# Patient Record
Sex: Male | Born: 1970 | Race: White | Hispanic: No | Smoking: Current every day smoker
Health system: Southern US, Community
[De-identification: ages and names within clinical notes are randomized; demographics above are authoritative.]

## PROBLEM LIST (undated history)

## (undated) DIAGNOSIS — Z8701 Personal history of pneumonia (recurrent): Secondary | ICD-10-CM

---

## 2018-03-01 ENCOUNTER — Ambulatory Visit
Admission: EM | Admit: 2018-03-01 | Discharge: 2018-03-01 | Disposition: A | Discharge status: Home / Self Care | Payer: No Typology Code available for payment source | Attending: Family Medicine | Admitting: Family Medicine

## 2018-03-01 ENCOUNTER — Ambulatory Visit (INDEPENDENT_AMBULATORY_CARE_PROVIDER_SITE_OTHER): Payer: No Typology Code available for payment source

## 2018-03-01 DIAGNOSIS — F17210 Nicotine dependence, cigarettes, uncomplicated: Secondary | ICD-10-CM | POA: Diagnosis not present

## 2018-03-01 DIAGNOSIS — J40 Bronchitis, not specified as acute or chronic: Secondary | ICD-10-CM

## 2018-03-01 DIAGNOSIS — R0602 Shortness of breath: Secondary | ICD-10-CM

## 2018-03-01 DIAGNOSIS — J209 Acute bronchitis, unspecified: Secondary | ICD-10-CM

## 2018-03-01 DIAGNOSIS — R062 Wheezing: Secondary | ICD-10-CM

## 2018-03-01 HISTORY — DX: Personal history of pneumonia (recurrent): Z87.01

## 2018-03-01 MED ORDER — PREDNISONE 50 MG PO TABS: ORAL_TABLET | ORAL | 0 refills | Status: DC

## 2018-03-01 MED ORDER — ALBUTEROL SULFATE HFA 108 (90 BASE) MCG/ACT IN AERS: 1.0000 | INHALATION_SPRAY | Freq: Four times a day (QID) | RESPIRATORY_TRACT | 0 refills | Status: DC | PRN

## 2018-03-01 MED ORDER — METHYLPREDNISOLONE SODIUM SUCC 125 MG IJ SOLR
125.0000 mg | Freq: Once | INTRAMUSCULAR | Status: AC
  Administered 2018-03-01: 125 mg via INTRAMUSCULAR

## 2018-03-01 MED ORDER — AZITHROMYCIN 250 MG PO TABS: ORAL_TABLET | ORAL | 0 refills | Status: DC

## 2018-03-01 MED ORDER — IPRATROPIUM-ALBUTEROL 0.5-2.5 (3) MG/3ML IN SOLN
3.0000 mL | Freq: Four times a day (QID) | RESPIRATORY_TRACT | Status: DC
  Administered 2018-03-01: 3 mL via RESPIRATORY_TRACT

## 2018-03-01 NOTE — ED Provider Notes (Addendum)
[facility]    CSN: [id number] Arrival date & time: 03/01/18  1247  History   Chief Complaint Chief Complaint  Patient presents with  . Wheezing   HPI  47 year old male presents with cough, wheezing, shortness of breath, subjective fever.  Patient reports a 4-day history of cough, shortness of breath, wheezing, and subjective fever.  Patient states that he is more short of breath with exertion.  Symptoms are severe.  He has taken Tylenol and NyQuil without relief.  He is a current smoker.  No documented history of COPD.  Reports a history of pneumonia and states that it feels similar to past occurrence.  No other reported symptoms.  No other complaints.  PMH, Surgical Hx, Family Hx, Social History reviewed and updated as below.  Past Medical History:  Diagnosis Date  . History of pneumonia    Surgical Hx:  No past surgeries.   Home Medications    Prior to Admission medications   Medication Sig Start Date End Date Taking? Authorizing Provider  albuterol (PROVENTIL HFA;VENTOLIN HFA) 108 (90 Base) MCG/ACT inhaler Inhale 1-2 puffs into the lungs every 6 (six) hours as needed for wheezing or shortness of breath. 03/01/18   Tommie Sams, DO  azithromycin (ZITHROMAX) 250 MG tablet 2 tablets on day 1, then 1 tablet daily on days 2-5. 03/01/18   Tommie Sams, DO  predniSONE (DELTASONE) 50 MG tablet 1 tablet daily x 5 days. Start 11/3. 03/01/18   Tommie Sams, DO    Family History Family History  Problem Relation Age of Onset  . Healthy Mother   . Healthy Father     Social History Social History   Tobacco Use  . Smoking status: Current Every Day Smoker  . Smokeless tobacco: Never Used  Substance Use Topics  . Alcohol use: Yes    Frequency: Never  . Drug use: Not on file    Allergies   Patient has no known allergies.   Review of Systems Review of Systems  Constitutional: Positive for fever.  Respiratory: Positive for cough, chest tightness, shortness of  breath and wheezing.    Physical Exam Triage Vital Signs ED Triage Vitals  Enc Vitals Group     BP 03/01/18 1256 125/63     Pulse Rate 03/01/18 1256 (!) 109     Resp 03/01/18 1256 18     Temp 03/01/18 1256 98.5 F (36.9 C)     Temp Source 03/01/18 1256 Oral     SpO2 03/01/18 1256 93 %     Weight 03/01/18 1258 240 lb (108.9 kg)     Height --      Head Circumference --      Peak Flow --      Pain Score 03/01/18 1257 0     Pain Loc --      Pain Edu? --      Excl. in GC? --    Updated Vital Signs BP 125/63 (BP Location: Left Arm)   Pulse (!) 109   Temp 98.5 F (36.9 C) (Oral)   Resp 18   Wt 108.9 kg   SpO2 93%   Visual Acuity Right Eye Distance:   Left Eye Distance:   Bilateral Distance:    Right Eye Near:   Left Eye Near:    Bilateral Near:     Physical Exam  Constitutional: He is oriented to person, place, and time. He appears well-developed.  Patient appears to be in mild respiratory distress.  Tachypneic.  HENT:  Head: Normocephalic and atraumatic.  Oropharynx with diffuse erythema.  Eyes: Conjunctivae are normal. Right eye exhibits no discharge. Left eye exhibits no discharge.  Cardiovascular:  Tachycardia.  Regular rhythm.  Pulmonary/Chest:  Diffuse coarse breath sounds and expiratory wheezing.  Tachypnea noted.  Neurological: He is alert and oriented to person, place, and time.  Skin: Skin is warm. No rash noted.  Psychiatric: He has a normal mood and affect. His behavior is normal.  Nursing note and vitals reviewed.  UC Treatments / Results  Labs (all labs ordered are listed, but only abnormal results are displayed) Labs Reviewed - No data to display  EKG None  Radiology Dg Chest 2 View  Result Date: 03/01/2018 CLINICAL DATA:  Shortness of breath and wheezing for 2-3 days. EXAM: CHEST - 2 VIEW COMPARISON:  None. FINDINGS: Cardiomediastinal silhouette is normal. Mediastinal contours appear intact. There is no evidence of focal airspace  consolidation, pleural effusion or pneumothorax. Mild peribronchial thickening with central predominance. There is a 7 mm hyperdense nodule which projects in the anterior thorax seen on the lateral view. Osseous structures are without acute abnormality. Soft tissues are grossly normal. IMPRESSION: Mild peribronchial thickening with central predominance which may be seen with reactive airway disease or acute bronchitis. 7 mm hyperdense nodule projects in the anterior mid thorax, seen on the lateral view. This may represent a pulmonary nodule or a sclerotic focus within an overlying rib. Further evaluation with chest CT without contrast on a nonemergent basis may be considered. Electronically Signed   By: Ted Mcalpine M.D.   On: 03/01/2018 13:56    Procedures Procedures (including critical care time)  Medications Ordered in UC Medications  methylPREDNISolone sodium succinate (SOLU-MEDROL) 125 mg/2 mL injection 125 mg (125 mg Intramuscular Given 03/01/18 1345)    Initial Impression / Assessment and Plan / UC Course  I have reviewed the triage vital signs and the nursing notes.  Pertinent labs & imaging results that were available during my care of the patient were reviewed by me and considered in my medical decision making (see chart for details).    47 year old male presents with acute bronchitis.  His chest x-ray was negative for pneumonia.  He does have a 7 mm nodule that needs outpatient CT.  I informed the patient of this.  Patient was given albuterol as well as Solu-Medrol here.  Discharging home on azithromycin, prednisone to be started tomorrow, and albuterol.  Final Clinical Impressions(s) / UC Diagnoses   Final diagnoses:  Bronchitis     Discharge Instructions     Medications as prescribed.  You need outpatient CT for follow up of a 7 mm area.   Take care  Dr. Adriana Simas     ED Prescriptions    Medication Sig Dispense Auth. Provider   predniSONE (DELTASONE) 50 MG tablet  1 tablet daily x 5 days. Start 11/3. 5 tablet Ebany Bowermaster G, DO   albuterol (PROVENTIL HFA;VENTOLIN HFA) 108 (90 Base) MCG/ACT inhaler Inhale 1-2 puffs into the lungs every 6 (six) hours as needed for wheezing or shortness of breath. 1 Inhaler Jerrard Bradburn G, DO   azithromycin (ZITHROMAX) 250 MG tablet 2 tablets on day 1, then 1 tablet daily on days 2-5. 6 tablet Tommie Sams, DO     Controlled Substance Prescriptions [place] Controlled Substance Registry consulted? Not Applicable   Tommie Sams, DO 03/01/18 1409    Tommie Sams, DO 03/01/18 1409

## 2018-03-01 NOTE — ED Triage Notes (Signed)
Pt here for dry cough, wheezing, sob, for 3-4 days. Had a fever in the beginning. Has been taking Nyquil and tylenol pm without relief but does help him sleep.

## 2018-03-01 NOTE — ED Notes (Signed)
Patient transported to X-ray 

## 2018-03-01 NOTE — Discharge Instructions (Signed)
Medications as prescribed.  You need outpatient CT for follow up of a 7 mm area.   Take care  Dr. Adriana Simas

## 2019-11-21 IMAGING — CR DG CHEST 2V
2 series · 2 of 2 positions shown · non-contrast
Comparison: None.

CLINICAL DATA: Shortness of breath and wheezing for 2-3 days.

EXAM:
CHEST - 2 VIEW

[chest pa]
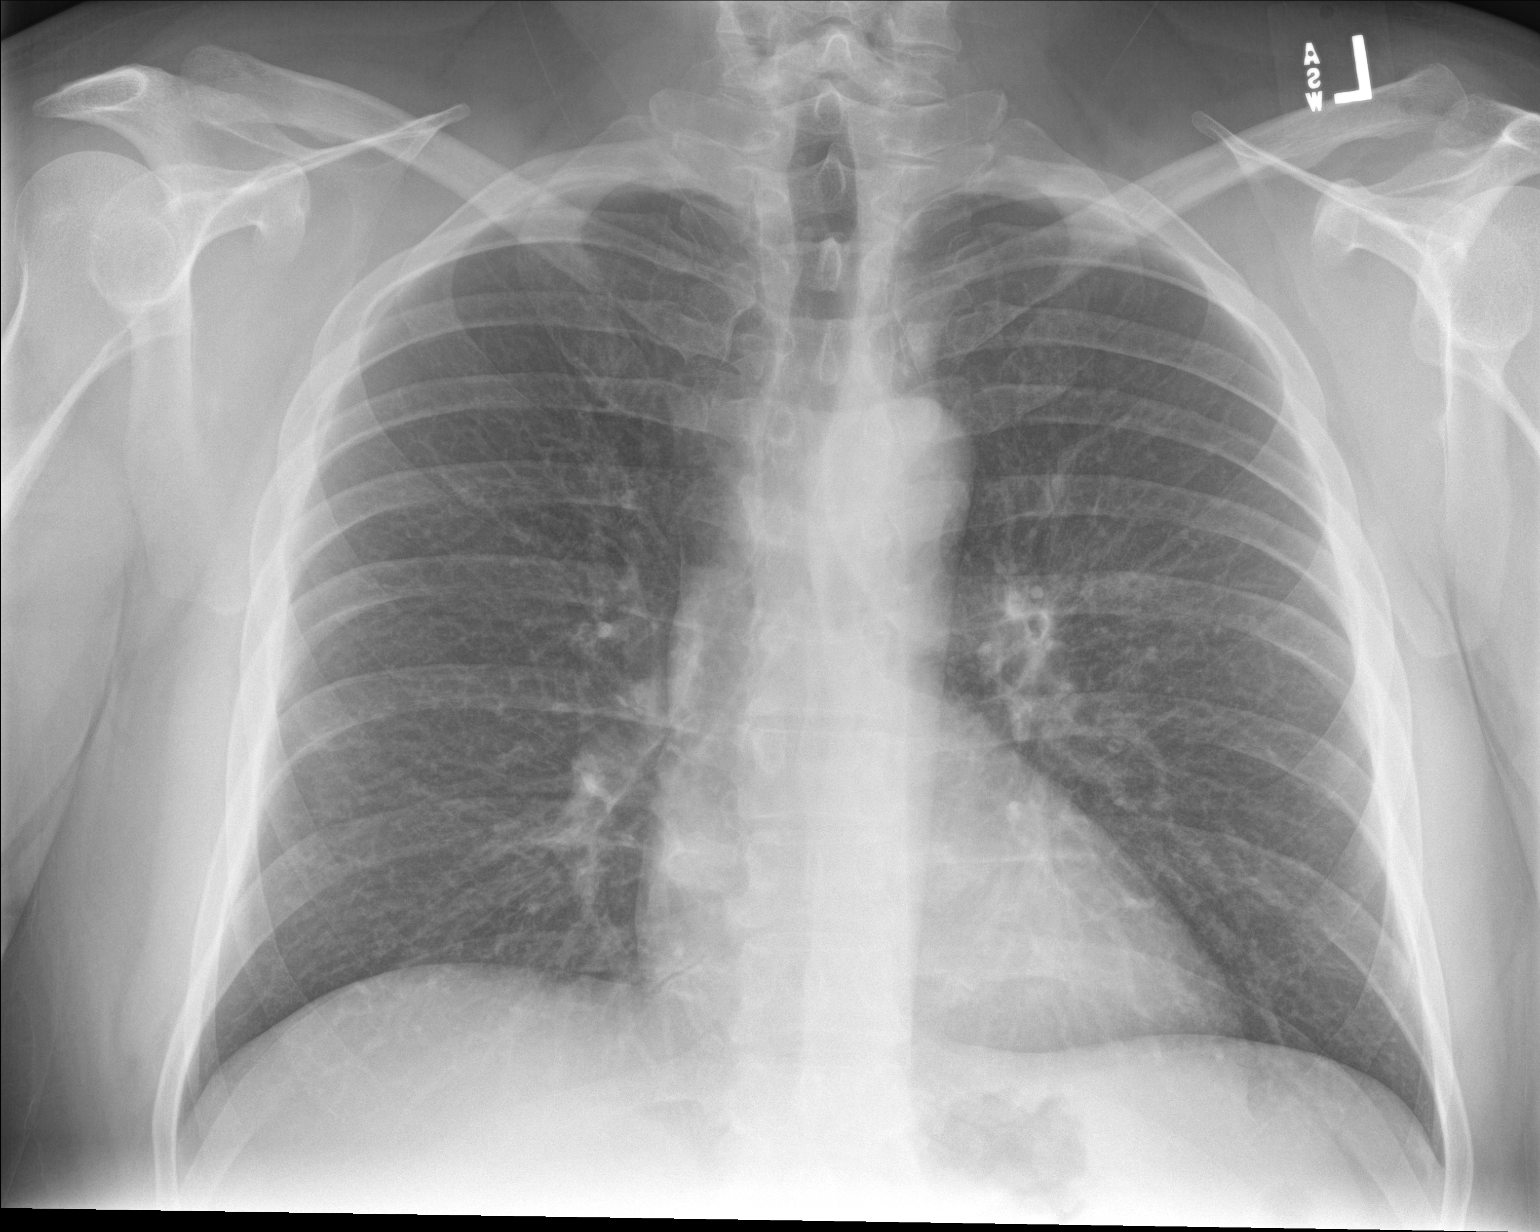

[chest lat]
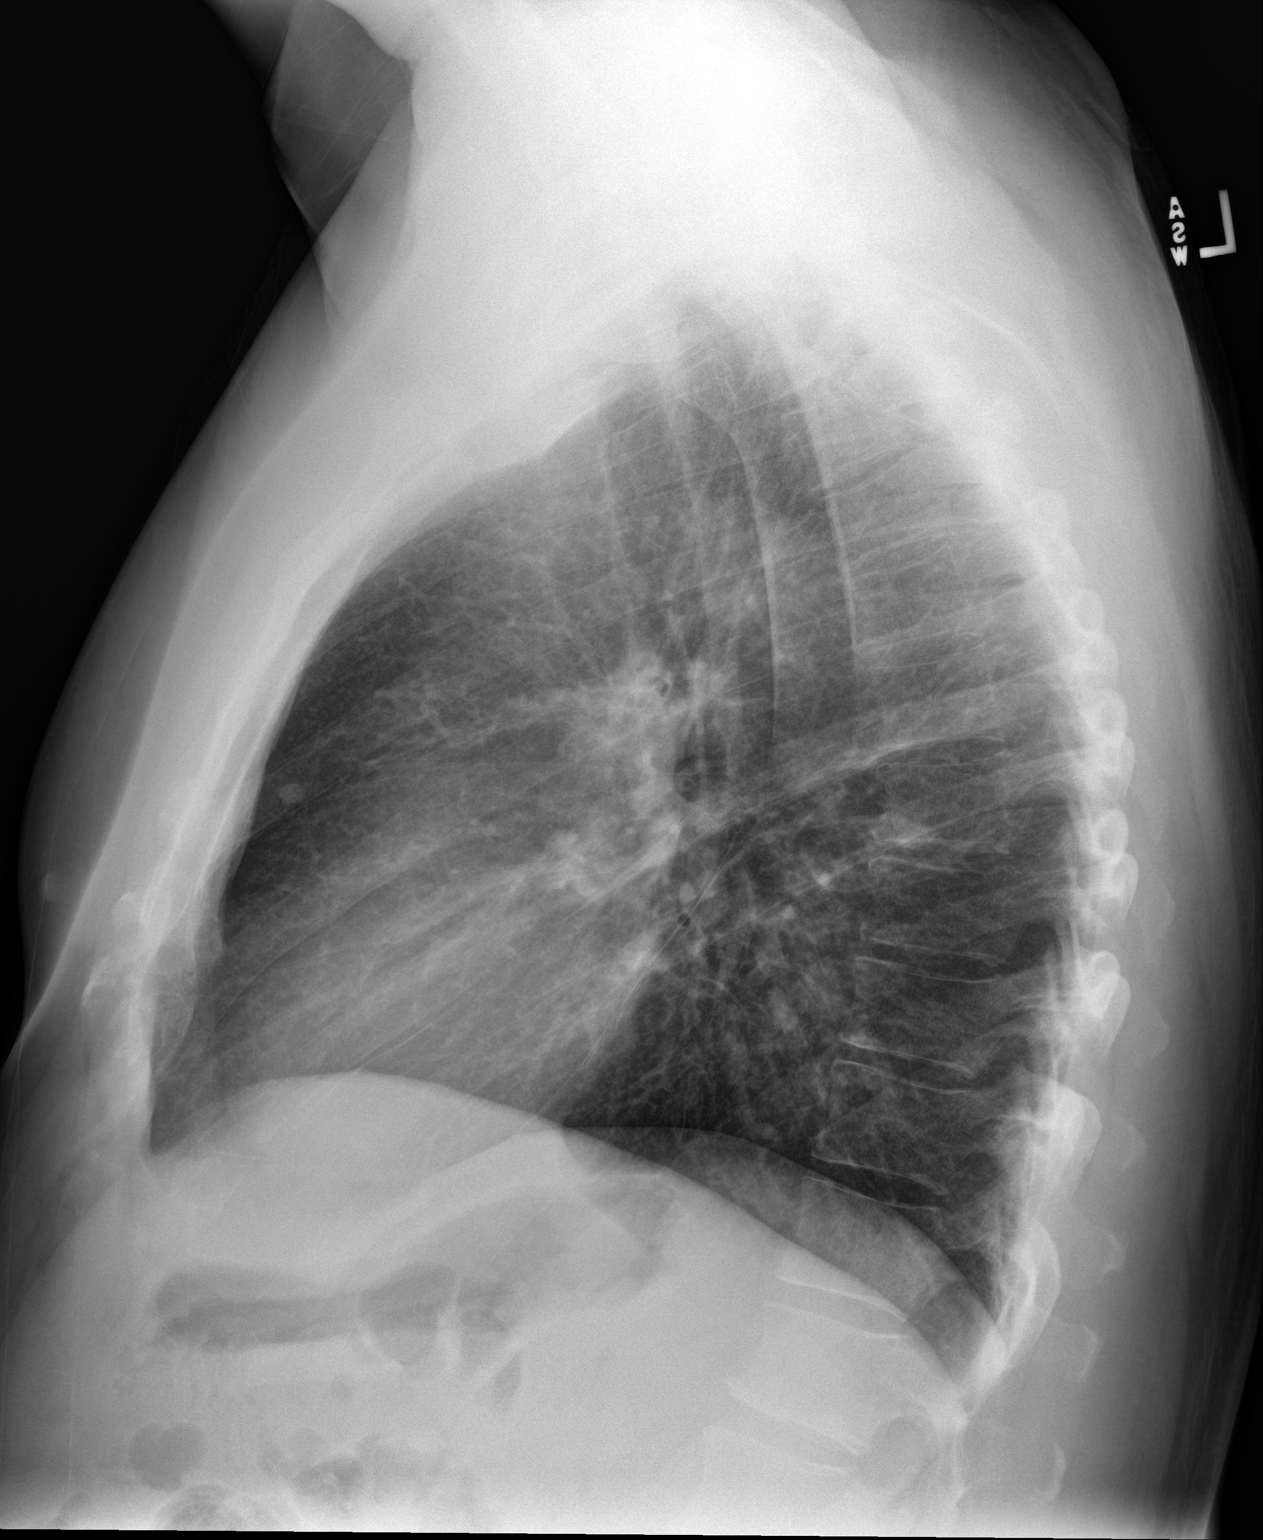

[2 of 2 positions shown; findings below may reference images not displayed]

FINDINGS: Cardiomediastinal silhouette is normal. Mediastinal contours appear
intact.

There is no evidence of focal airspace consolidation, pleural
effusion or pneumothorax. Mild peribronchial thickening with central
predominance. There is a 7 mm hyperdense nodule which projects in
the anterior thorax seen on the lateral view.

Osseous structures are without acute abnormality. Soft tissues are
grossly normal.
IMPRESSION: Mild peribronchial thickening with central predominance which may be
seen with reactive airway disease or acute bronchitis.

7 mm hyperdense nodule projects in the anterior mid thorax, seen on
the lateral view. This may represent a pulmonary nodule or a
sclerotic focus within an overlying rib. Further evaluation with
chest CT without contrast on a nonemergent basis may be considered.

## 2023-12-02 ENCOUNTER — Ambulatory Visit: Payer: Self-pay | Admitting: Family Medicine

## 2023-12-02 ENCOUNTER — Ambulatory Visit
Admission: EM | Admit: 2023-12-02 | Discharge: 2023-12-02 | Disposition: A | Discharge status: Home / Self Care | Attending: Family Medicine | Admitting: Family Medicine

## 2023-12-02 ENCOUNTER — Ambulatory Visit (INDEPENDENT_AMBULATORY_CARE_PROVIDER_SITE_OTHER)

## 2023-12-02 DIAGNOSIS — J209 Acute bronchitis, unspecified: Secondary | ICD-10-CM

## 2023-12-02 DIAGNOSIS — F17210 Nicotine dependence, cigarettes, uncomplicated: Secondary | ICD-10-CM

## 2023-12-02 DIAGNOSIS — R0602 Shortness of breath: Secondary | ICD-10-CM

## 2023-12-02 MED ORDER — PREDNISONE 50 MG PO TABS: ORAL_TABLET | ORAL | 0 refills | Status: AC

## 2023-12-02 MED ORDER — IPRATROPIUM-ALBUTEROL 0.5-2.5 (3) MG/3ML IN SOLN
3.0000 mL | Freq: Once | RESPIRATORY_TRACT | Status: AC
  Administered 2023-12-02: 3 mL via RESPIRATORY_TRACT

## 2023-12-02 MED ORDER — ALBUTEROL SULFATE HFA 108 (90 BASE) MCG/ACT IN AERS: 2.0000 | INHALATION_SPRAY | RESPIRATORY_TRACT | 0 refills | Status: DC | PRN

## 2023-12-02 MED ORDER — PROMETHAZINE-DM 6.25-15 MG/5ML PO SYRP: 5.0000 mL | ORAL_SOLUTION | Freq: Four times a day (QID) | ORAL | 0 refills | Status: AC | PRN

## 2023-12-02 MED ORDER — ALBUTEROL SULFATE HFA 108 (90 BASE) MCG/ACT IN AERS: 2.0000 | INHALATION_SPRAY | RESPIRATORY_TRACT | 0 refills | Status: AC | PRN

## 2023-12-02 MED ORDER — DEXAMETHASONE SODIUM PHOSPHATE 10 MG/ML IJ SOLN
10.0000 mg | Freq: Once | INTRAMUSCULAR | Status: AC
  Administered 2023-12-02: 10 mg via INTRAMUSCULAR

## 2023-12-02 MED ORDER — PROMETHAZINE-DM 6.25-15 MG/5ML PO SYRP: 5.0000 mL | ORAL_SOLUTION | Freq: Four times a day (QID) | ORAL | 0 refills | Status: DC | PRN

## 2023-12-02 MED ORDER — PREDNISONE 50 MG PO TABS: ORAL_TABLET | ORAL | 0 refills | Status: DC

## 2023-12-02 MED ORDER — AZITHROMYCIN 250 MG PO TABS: ORAL_TABLET | ORAL | 0 refills | Status: AC

## 2023-12-02 MED ORDER — AZITHROMYCIN 250 MG PO TABS: ORAL_TABLET | ORAL | 0 refills | Status: DC

## 2023-12-02 NOTE — Discharge Instructions (Addendum)
 Stop by the pharmacy to pick up your prescriptions.  Follow up with your primary care provider or return to the urgent care, if not improving.   The lung nodule seen back in 2019 was seen again today. Your chest xray did not show evidence of pneumonia though the radiologist has not yet read it. If they find something that I didn't, I will call you.  Follow up with your primary care provider or lung care provider.

## 2023-12-02 NOTE — ED Triage Notes (Signed)
 Sx x 1 week  SOB Chills  Productive cough with yellow mucus

## 2023-12-02 NOTE — ED Provider Notes (Signed)
 [facility]    CSN: [id number] Arrival date & time: 12/02/23  0900      History   Chief Complaint Chief Complaint  Patient presents with   Cough   Shortness of Breath    HPI Nicholas Lowe is a 53 y.o. male.   HPI  History obtained from the patient. Nicholas Lowe presents for shortness of breath, productive cough and wheezing that started about a week ago. Believes he has pneumonia.  Feels like his lungs are filled up with mucus.  Has no energy. Shortness of breath get worse with movement. Denies fever or chills. Took some Nyquil last night.   No history of asthma. Denies history of COPD. Smoked since he was about 53 years old. Smokes 2 ppd.     Past Medical History:  Diagnosis Date   History of pneumonia     There are no active problems to display for this patient.   History reviewed. No pertinent surgical history.     Home Medications    Prior to Admission medications   Medication Sig Start Date End Date Taking? Authorizing Provider  albuterol  (VENTOLIN  HFA) 108 (90 Base) MCG/ACT inhaler Inhale 2 puffs into the lungs every 4 (four) hours as needed for wheezing or shortness of breath. 12/02/23   Okechukwu Regnier, DO  azithromycin  (ZITHROMAX ) 250 MG tablet 2 tablets on day 1, then 1 tablet daily on days 2-5. 12/02/23   Riggs Dineen, DO  predniSONE  (DELTASONE ) 50 MG tablet 1 tablet daily x 5 days. Start 11/3. 12/02/23   Zeph Riebel, DO  promethazine -dextromethorphan (PROMETHAZINE -DM) 6.25-15 MG/5ML syrup Take 5 mLs by mouth 4 (four) times daily as needed. 12/02/23   Lucia Mccreadie, DO    Family History Family History  Problem Relation Age of Onset   Healthy Mother    Healthy Father     Social History Social History   Tobacco Use   Smoking status: Every Day   Smokeless tobacco: Never  Vaping Use   Vaping status: Never Used  Substance Use Topics   Alcohol use: Yes     Allergies   Patient has no known allergies.   Review of  Systems Review of Systems: negative unless otherwise stated in HPI.      Physical Exam Triage Vital Signs ED Triage Vitals [12/02/23 0923]  Encounter Vitals Group     BP 134/81     Girls Systolic BP Percentile      Girls Diastolic BP Percentile      Boys Systolic BP Percentile      Boys Diastolic BP Percentile      Pulse Rate (!) 103     Resp (!) 24     Temp 98.9 F (37.2 C)     Temp Source Oral     SpO2 92 %     Weight      Height      Head Circumference      Peak Flow      Pain Score 0     Pain Loc      Pain Education      Exclude from Growth Chart    No data found.  Updated Vital Signs BP 134/81 (BP Location: Right Arm)   Pulse (!) 103   Temp 98.9 F (37.2 C) (Oral)   Resp (!) 24   SpO2 92%   Visual Acuity Right Eye Distance:   Left Eye Distance:   Bilateral Distance:    Right Eye Near:   Left Eye Near:  Bilateral Near:     Physical Exam GEN:     alert, non-toxic appearing male    HENT:  mucus membranes moist, oropharyngeal without lesions or erythema, no nasal discharge EYES:   no scleral injection or discharge RESP:  Tachypnea, coarse breathe sounds bilaterally CVS:   regular  rhythm, tachycardic  Skin:   warm and dry    UC Treatments / Results  Labs (all labs ordered are listed, but only abnormal results are displayed) Labs Reviewed - No data to display  EKG   Radiology DG Chest 2 View Result Date: 12/02/2023 CLINICAL DATA:  Shortness of breath and cough for 1 week. EXAM: CHEST - 2 VIEW COMPARISON:  03/01/2018. FINDINGS: There is new/increased bilateral bronchial wall thickening/peribronchial cuffing. Findings are most commonly seen with bronchitis or reactive airway disease, such as asthma. Bilateral lung fields are otherwise clear. No acute consolidation or lung collapse. Bilateral costophrenic angles are clear. Normal cardio-mediastinal silhouette. No acute osseous abnormalities. The soft tissues are within normal limits. IMPRESSION:  New/increased bilateral bronchial wall thickening/peribronchial cuffing. Findings are most commonly seen with bronchitis or reactive airway disease, such as asthma. Bilateral lung fields are otherwise clear. No acute consolidation or lung collapse. Electronically Signed   By: Ree Molt M.D.   On: 12/02/2023 10:28      Procedures Procedures (including critical care time)  Medications Ordered in UC Medications  ipratropium-albuterol  (DUONEB) 0.5-2.5 (3) MG/3ML nebulizer solution 3 mL (3 mLs Nebulization Given 12/02/23 0942)  ipratropium-albuterol  (DUONEB) 0.5-2.5 (3) MG/3ML nebulizer solution 3 mL (3 mLs Nebulization Given 12/02/23 0935)  dexamethasone  (DECADRON ) injection 10 mg (10 mg Intramuscular Given 12/02/23 0954)    Initial Impression / Assessment and Plan / UC Course  I have reviewed the triage vital signs and the nursing notes.  Pertinent labs & imaging results that were available during my care of the patient were reviewed by me and considered in my medical decision making (see chart for details).  Clinical Course as of 12/02/23 1200  Mon Dec 02, 2023  1016 Reassessment: pt breathing more comfortably.  Tachypnea has resolved.   [VB]    Clinical Course User Index [VB] Tabor Bartram, DO      Pt is a 53 y.o. male who is a smoker presents for 1 week of productive cough and shortness of breath that is not improving.   On chart review, pt had 7 mm nodule in the anterior mid-thorax and recommend CT Chest at his urgent care visit in Nov 2019. Pt with > 50 pack years though he denies COPD. [facility] but he hasn't heard anything.    Samin is  afebrile here without recent antipyretics. Satting 89-92% on room air. Overall pt is  non-toxic appearing, well hydrated, without respiratory distress. Pulmonary exam is remarkable for coarse breathe sounds and expiratory wheezing, with decreased air movement and productive cough.  After shared decision making, we  will pursue chest x-ray.  COVID  and influenza testing deferred due to length of symptoms.   Pt given 2 - DuoNebs and IM Decadron . On reassessment, he has good air movement and wheezing has resolved.   Chest xray personally reviewed by me without focal pneumonia, pleural effusion, cardiomegaly or pneumothorax. There is a anterior lung nodule seen again on the lateral chest xray.    Treat acute bronchitis with steroids and antibiotics as below.  Albuterol  inhaler. Promethazine  DM cough syrup given for cough and allow patient to rest.  Typical duration of symptoms discussed. Return and ED precautions given and patient voiced understanding.   Discussed MDM, treatment plan and plan for follow-up with patient who agrees with plan.   Time based billing statement Performed by: Caprice Porteous, DO    Total time: 64 minutes  Time was exclusive of separately billable procedures and treating other patients.   Time spent personally by me on the following activities: reviewing records, development of treatment plan with patient, evaluation of patient's response to treatment, examination of patient, obtaining history from patient, ordering and performing treatments and interventions outside of separately billable procedures, ordering and review of laboratory studies, ordering and review of radiographic studies, pulse oximetry, coordinating care, counseling patient on smoking and COPD and re-evaluation of patient's condition.    Final Clinical Impressions(s) / UC Diagnoses   Final diagnoses:  SOB (shortness of breath)  Acute bronchitis, unspecified organism  Heavy smoker (more than 20 cigarettes per day)     Discharge Instructions      Stop by the pharmacy to pick up your prescriptions.  Follow up with your primary care provider or return to the urgent care, if not improving.   The lung nodule seen back in 2019 was seen again today. Your chest xray did not show evidence of pneumonia though the  radiologist has not yet read it. If they find something that I didn't, I will call you.  Follow up with your primary care provider or lung care provider.       ED Prescriptions     Medication Sig Dispense Auth. Provider   predniSONE  (DELTASONE ) 50 MG tablet 1 tablet daily x 5 days. Start 11/3. 5 tablet Milon Dethloff, DO   azithromycin  (ZITHROMAX ) 250 MG tablet 2 tablets on day 1, then 1 tablet daily on days 2-5. 6 tablet Irvin Bastin, DO   albuterol  (VENTOLIN  HFA) 108 (90 Base) MCG/ACT inhaler Inhale 2 puffs into the lungs every 4 (four) hours as needed for wheezing or shortness of breath. 1 each Kolbi Altadonna, DO   promethazine -dextromethorphan (PROMETHAZINE -DM) 6.25-15 MG/5ML syrup Take 5 mLs by mouth 4 (four) times daily as needed. 118 mL Allex Madia, DO      PDMP not reviewed this encounter.   Ziggy Reveles, DO 12/02/23 1201
